# Patient Record
Sex: Female | Born: 2000
Health system: Southern US, Community
[De-identification: ages and names within clinical notes are randomized; demographics above are authoritative.]

## PROBLEM LIST (undated history)

## (undated) DIAGNOSIS — E039 Hypothyroidism, unspecified: Secondary | ICD-10-CM

## (undated) DIAGNOSIS — L309 Dermatitis, unspecified: Secondary | ICD-10-CM

## (undated) DIAGNOSIS — L709 Acne, unspecified: Secondary | ICD-10-CM

## (undated) DIAGNOSIS — E079 Disorder of thyroid, unspecified: Secondary | ICD-10-CM

## (undated) DIAGNOSIS — T7840XA Allergy, unspecified, initial encounter: Secondary | ICD-10-CM

## (undated) HISTORY — DX: Acne, unspecified: L70.9

## (undated) HISTORY — DX: Allergy, unspecified, initial encounter: T78.40XA

## (undated) HISTORY — DX: Dermatitis, unspecified: L30.9

## (undated) HISTORY — DX: Hypothyroidism, unspecified: E03.9

## (undated) HISTORY — DX: Disorder of thyroid, unspecified: E07.9

---
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
## 2019-01-06 ENCOUNTER — Other Ambulatory Visit (INDEPENDENT_AMBULATORY_CARE_PROVIDER_SITE_OTHER): Payer: Self-pay | Admitting: *Deleted

## 2019-01-06 DIAGNOSIS — E034 Atrophy of thyroid (acquired): Secondary | ICD-10-CM

## 2019-01-06 MED ORDER — LEVOTHYROXINE SODIUM 75 MCG PO TABS: ORAL_TABLET | ORAL | 1 refills | Status: DC

## 2019-01-09 ENCOUNTER — Encounter: Payer: Self-pay | Admitting: Allergy and Immunology

## 2019-01-09 ENCOUNTER — Ambulatory Visit (INDEPENDENT_AMBULATORY_CARE_PROVIDER_SITE_OTHER): Payer: BLUE CROSS/BLUE SHIELD | Admitting: Allergy and Immunology

## 2019-01-09 ENCOUNTER — Other Ambulatory Visit: Payer: Self-pay

## 2019-01-09 DIAGNOSIS — L2089 Other atopic dermatitis: Secondary | ICD-10-CM

## 2019-01-09 MED ORDER — PREDNISONE 10 MG PO TABS: ORAL_TABLET | ORAL | 0 refills | Status: DC

## 2019-01-09 MED ORDER — PIMECROLIMUS 1 % EX CREA: TOPICAL_CREAM | CUTANEOUS | 3 refills | Status: DC

## 2019-01-09 MED ORDER — MOMETASONE FUROATE 0.1 % EX OINT: TOPICAL_OINTMENT | CUTANEOUS | 3 refills | Status: AC

## 2019-01-09 NOTE — Patient Instructions (Addendum)
  1.  Shower followed by Elidel followed by mometasone 0.1% ointment 1 time per day to areas of eczema  2.  May apply Elidel followed by mometasone 0.1% ointment a second time a day if needed  3.  Prednisone 10 mg -1+1/2 tablet 1 time per day for 10 days, then 1 tablet 1 time per day for 10 days, then 1/2 tablet 1 time per day for 10 days  4.  Cetirizine 10 mg -1-2 tablets 1-2 times per day (MAX = 40 mg)  5.  Return to clinic in 30 days or earlier if problem

## 2019-01-09 NOTE — Progress Notes (Signed)
   [place] [facility] [place] [facility] [place] [facility] [place] - [place]   Follow-up Note  Referring Provider: Johny Blamer, MD Primary Provider: Johny Blamer, MD Date of Office Visit: 01/09/2019  Subjective:   Sherry Erickson (DOB: October 24, 2000) is a 18 y.o. female who [facility]2019 in re-evaluation of the following:  HPI: This is a E - Med visit requested by patient who is located at home.  Sherry Erickson was seen in this clinic for urticaria.  Her last visit to this clinic was 29 June 2015.  Bad Itchy rash for a year if not longer. Saw dermatologist and treated with topical agents. Minimal response. Apparently patch tested and allergic to gold.  Very active this spring. Given prednisone April with less rash but flare after finishing prednisone.  All her skin feels like sandpaper.   Allergies as of 01/09/2019      Reactions   Gold-containing Drug Products    Milk-related Compounds Rash      Medication List    diphenhydrAMINE 12.5 MG/5ML liquid Commonly known as:  BENADRYL Take by mouth 4 (four) times daily as needed.   hydrOXYzine 10 MG tablet Commonly known as:  ATARAX/VISTARIL USE AS DIRECTED AT BEDTIME 30   levothyroxine 75 MCG tablet Commonly known as:  SYNTHROID TAKE 1/2 TABLETS (37.5 MCG TOTAL) BY MOUTH DAILY.   loratadine 10 MG tablet Commonly known as:  CLARITIN Take 10 mg by mouth daily.       Past Medical History:  Diagnosis Date  . Acne   . Eczema   . Hypothyroidism     History reviewed. No pertinent surgical history.  Review of systems negative except as noted in HPI / PMHx or noted below:  Review of Systems  Constitutional: Negative.   HENT: Negative.   Eyes: Negative.   Respiratory: Negative.   Cardiovascular: Negative.   Gastrointestinal: Negative.   Genitourinary: Negative.   Musculoskeletal: Negative.   Skin: Negative.   Neurological: Negative.   Endo/Heme/Allergies: Negative.   Psychiatric/Behavioral:  Negative.      Objective:   There were no vitals filed for this visit.        Physical Exam  -via Web[employer] Koya shows me multiple patches of red excoriated plaques across arms and chest and abdomen.  Diagnostics: none  Assessment and Plan:   1. Other atopic dermatitis     1.  Shower followed by Elidel followed by mometasone 0.1% ointment 1 time per day to areas of eczema  2.  May apply Elidel followed by mometasone 0.1% ointment a second time a day if needed  3.  Prednisone 10 mg -1+1/2 tablet 1 time per day for 10 days, then 1 tablet 1 time per day for 10 days, then 1/2 tablet 1 time per day for 10 days  4.  Cetirizine 10 mg -1-2 tablets 1-2 times per day (MAX = 40 mg)  5.  Return to clinic in 30 days or earlier if problem  We will treat Sherry Erickson with a combination of therapy directed against inflammation of her skin and see her back in this clinic in 30 days to assess her response.  Should she not have optimal response or should she not be able to maintain optimal response after discontinuing prednisone she would be a candidate for dupilumab.  Total patient interaction time 20 minutes  Laurette Schimke, MD Allergy / Immunology [facility]

## 2019-01-13 ENCOUNTER — Encounter: Payer: Self-pay | Admitting: Allergy and Immunology

## 2019-01-23 NOTE — Progress Notes (Signed)
Patient is at home.  Provider is in the office.  Start time: 2:36 pm End time: 3:03 pm Verbal consent given to file insurance.

## 2019-02-10 ENCOUNTER — Ambulatory Visit: Payer: Self-pay | Admitting: Allergy and Immunology

## 2019-02-10 ENCOUNTER — Encounter: Payer: Self-pay | Admitting: Allergy and Immunology

## 2019-02-10 ENCOUNTER — Ambulatory Visit (INDEPENDENT_AMBULATORY_CARE_PROVIDER_SITE_OTHER): Payer: BC Managed Care – PPO | Admitting: Allergy and Immunology

## 2019-02-10 ENCOUNTER — Other Ambulatory Visit: Payer: Self-pay

## 2019-02-10 VITALS — BP 104/72 | HR 81 | Temp 98.7°F | Resp 16 | Ht <= 58 in | Wt 108.4 lb

## 2019-02-10 DIAGNOSIS — L2089 Other atopic dermatitis: Secondary | ICD-10-CM

## 2019-02-10 NOTE — Patient Instructions (Addendum)
  1.  Shower followed by Elidel followed by mometasone 0.1% ointment 1- 3 times per week to areas of eczema  2.  May apply Elidel followed by mometasone 0.1% ointment a second time a day if needed  3.  Cetirizine 10 mg -1-2 tablets 1-2 times per day (MAX = 40 mg)  4.  Dupilumab?  5.  Return to clinic in 8 weeks or earlier if problem

## 2019-02-10 NOTE — Progress Notes (Signed)
[facility]   Follow-up Note  Referring Provider: Shirline Frees, MD  Primary Provider: Shirline Frees, MD Date of Office Visit: 02/10/2019  Subjective:   Sherry Erickson (DOB: 04-27-2001) is a 18 y.o. female who returns to the [facility] on 02/10/2019 in re-evaluation of the following:  HPI: Sherry Erickson returns to this clinic and evaluation of atopic dermatitis.  We have made contact with each other on 09 Jan 2019 using an E-med visit at which point in time she had significant atopic dermatitis that was flared for which we started her on oral steroids.  Using a combination of topical agents and oral steroid she is much improved.  She is about 70% improved on this therapy.  She has 2 days of prednisone left.  Allergies as of 02/10/2019      Reactions   Gold-containing Drug Products    Milk-related Compounds Rash      Medication List    diphenhydrAMINE 12.5 MG/5ML liquid Commonly known as:  BENADRYL Take by mouth 4 (four) times daily as needed.   levothyroxine 75 MCG tablet Commonly known as:  SYNTHROID TAKE 1/2 TABLETS (37.5 MCG TOTAL) BY MOUTH DAILY.   loratadine 10 MG tablet Commonly known as:  CLARITIN Take 10 mg by mouth daily.   mometasone 0.1 % ointment Commonly known as:  ELOCON Apply to eczema once daily after bathing.  Can apply a second time of day if needed.   pimecrolimus 1 % cream Commonly known as:  ELIDEL Apply to eczema once daily after bathing.  Can apply a second time a day if needed.   predniSONE 10 MG tablet Commonly known as:  DELTASONE Take one and one-half tablet daily for ten days; then take one tablet daily for ten days; then take one-half tablet daily for ten days.       Past Medical History:  Diagnosis Date  . Acne   . Eczema   . Hypothyroidism     History reviewed. No pertinent surgical history.  Review of systems negative except as noted in HPI / PMHx or noted below:   Review of Systems  Constitutional: Negative.   HENT: Negative.   Eyes: Negative.   Respiratory: Negative.   Cardiovascular: Negative.   Gastrointestinal: Negative.   Genitourinary: Negative.   Musculoskeletal: Negative.   Skin: Negative.   Neurological: Negative.   Endo/Heme/Allergies: Negative.   Psychiatric/Behavioral: Negative.      Objective:   Vitals:   02/10/19 0837  BP: 104/72  Pulse: 81  Resp: 16  Temp: 98.7 F (37.1 C)  SpO2: 99%   Height: 4\' 10"  (147.3 cm)  Weight: 108 lb 6.4 oz (49.2 kg)   Physical Exam Constitutional:      Appearance: She is not diaphoretic.  HENT:     Head: Normocephalic.     Right Ear: Tympanic membrane, ear canal and external ear normal.     Left Ear: Tympanic membrane, ear canal and external ear normal.     Nose: Nose normal. No mucosal edema or rhinorrhea.     Mouth/Throat:     Pharynx: Uvula midline. No oropharyngeal exudate.  Eyes:     Conjunctiva/sclera: Conjunctivae normal.  Neck:     Thyroid: No thyromegaly.     Trachea: Trachea normal. No tracheal tenderness or tracheal deviation.  Cardiovascular:     Rate and Rhythm: Normal rate and regular rhythm.     Heart sounds: Normal heart sounds, S1 normal and  S2 normal. No murmur.  Pulmonary:     Effort: No respiratory distress.     Breath sounds: Normal breath sounds. No stridor. No wheezing or rales.  Lymphadenopathy:     Head:     Right side of head: No tonsillar adenopathy.     Left side of head: No tonsillar adenopathy.     Cervical: No cervical adenopathy.  Skin:    Findings: Rash (Lichenified hyperpigmented slightly erythematous antecubital fossa, lateral neck, abdomen, lower back.) present. No erythema.     Nails: There is no clubbing.   Neurological:     Mental Status: She is alert.     Diagnostics: none  Assessment and Plan:   1. Other atopic dermatitis       1.  Shower followed by Elidel followed by mometasone 0.1% ointment 1- 3 times per week to areas  of eczema  2.  May apply Elidel followed by mometasone 0.1% ointment a second time a day if needed  3.  Cetirizine 10 mg -1-2 tablets 1-2 times per day (MAX = 40 mg)  4.  Dupilumab?  5.  Return to clinic in 8 weeks or earlier if problem  Sherry Erickson is better.  Her improvement comes in the context of using a systemic steroid along with topical agents.  If she flares once again as she completes her prednisone then she needs to consider starting dupilumab.  I have given her literature on this form of treatment during today's visit.  We will see what happens over the course of the next 8 weeks.  Laurette SchimkeEric Kozlow, MD Allergy / Immunology [facility]

## 2019-02-11 ENCOUNTER — Encounter: Payer: Self-pay | Admitting: Allergy and Immunology

## 2019-02-11 ENCOUNTER — Ambulatory Visit: Payer: Self-pay | Admitting: Allergy and Immunology

## 2019-02-13 ENCOUNTER — Other Ambulatory Visit: Payer: Self-pay | Admitting: *Deleted

## 2019-02-13 ENCOUNTER — Telehealth: Payer: Self-pay | Admitting: *Deleted

## 2019-02-13 NOTE — Telephone Encounter (Signed)
Patient's mom states that she feels like the patient breaks out to both Hydroxyzine and Cetirizine makes the rash worse. Mom states the Prednisone was helping but once she stated taking the Cetirizine a new rash to come up. Mom also states they feel like she may have a coconut allergy and would like to do some more testing in the future.

## 2019-02-17 NOTE — Telephone Encounter (Signed)
Please have Nanci start dupilumab.

## 2019-02-17 NOTE — Telephone Encounter (Signed)
L/M for patient to contact me to discuss.  Will need to get her Rx card.

## 2019-02-18 NOTE — Telephone Encounter (Signed)
Spoke to patient and mother regarding Dupixent.  Patient advised she wants think about it and she will let us know if she wants to proceed with therapy.

## 2019-03-10 ENCOUNTER — Other Ambulatory Visit (INDEPENDENT_AMBULATORY_CARE_PROVIDER_SITE_OTHER): Payer: Self-pay | Admitting: *Deleted

## 2019-03-10 DIAGNOSIS — E039 Hypothyroidism, unspecified: Secondary | ICD-10-CM

## 2019-03-11 LAB — T4, FREE: Free T4: 1.2 ng/dL (ref 0.8–1.4)

## 2019-03-11 LAB — TSH: TSH: 3.51 mIU/L

## 2019-03-11 LAB — T4: T4, Total: 9.4 ug/dL (ref 5.3–11.7)

## 2019-03-20 ENCOUNTER — Ambulatory Visit (INDEPENDENT_AMBULATORY_CARE_PROVIDER_SITE_OTHER): Payer: BLUE CROSS/BLUE SHIELD | Admitting: Family

## 2019-03-21 ENCOUNTER — Encounter (INDEPENDENT_AMBULATORY_CARE_PROVIDER_SITE_OTHER): Payer: Self-pay | Admitting: Family

## 2019-03-21 ENCOUNTER — Other Ambulatory Visit: Payer: Self-pay

## 2019-03-21 ENCOUNTER — Ambulatory Visit (INDEPENDENT_AMBULATORY_CARE_PROVIDER_SITE_OTHER): Payer: BC Managed Care – PPO | Admitting: Family

## 2019-03-21 VITALS — BP 96/72 | HR 72 | Wt 107.2 lb

## 2019-03-21 DIAGNOSIS — R6252 Short stature (child): Secondary | ICD-10-CM

## 2019-03-21 DIAGNOSIS — E039 Hypothyroidism, unspecified: Secondary | ICD-10-CM

## 2019-03-21 NOTE — Patient Instructions (Signed)
37.5 mcg of levothyroxine  Add fiber supplement daily to help with occasional constipation   6 month follow up

## 2019-03-21 NOTE — Progress Notes (Signed)
This is a Pediatric Specialist E-Visit follow up consult provided via [place] and their parent/guardian [name] consented to an E-Visit consult today.  Location of patient: [name] is at home  Location of provider: Hermenia Bers FNP is at home office  Patient was referred by Shirline Frees, MD   The following participants were involved in this E-Visit: Bethann Goo RMA, [place], Hermenia Bers FNP Sherry Erickson patient Sherry Erickson   Chief Complain/ Reason for E-Visit today: Hypothyroid follow up  Total time on call: This visit lasted >15 minutes. More then 50% of the visit was devoted to counseling.  Follow up: 6 months.      Subjective:  Subjective  Patient Name: Sherry Erickson Date of Birth: May 22, 2001  MRN: [id number]  Sherry Erickson  presents to the office today for follow-up evaluation and management of her hypothyroidism, unknown age, delayed bone age for suspected age.    HISTORY OF PRESENT ILLNESS:   Sherry Erickson is a 18 y.o. [name]   Sherry Erickson was accompanied by her mother.   1. Sherry Erickson was adopted from [place] in December 2012. She was believed to be about 18 years old at the time with an assigned birth date of September 30, 2000.She was reportedly found by the orphanage several days after birth.  She was adopted from an orphanage in a poor area with limited access to nutrition or medical care. On arrival in the [place] she had a battery of labs which included thyroid function studies and a bone age. Her bone age was read by radiology as consistent with 10 years. We read her film together in clinic today and felt that it was not as advanced as 10 although it is more advanced than 8 years 10 months (the prior plate). This would put her bone age somewhere around 9 years 3-6 months.   Her TSH on her initial labs was 6.32 with a free T4 of 0.87 ng/mL and a total T3 of 154 ng/dL.      2. The patient's last [facility] visit was on 12/2018. In the interim, she has been generally  healthy.    She reports that she is doing well. She is taking Levothyroxine every morning on empty stomach. No missed doses. Denies fatigue, constipation and cold intolerance. She has been working on improving her diet because her Erickson feels like it is not very health. She loves sweets. 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   3. Pertinent Review of Systems:  Review of Systems  Constitutional: Negative for malaise/fatigue and weight loss.  Eyes: Negative for blurred vision and photophobia.  Respiratory: Negative for cough and wheezing.   Cardiovascular: Negative for chest pain and palpitations.  Gastrointestinal: Negative for abdominal pain, constipation, diarrhea, nausea and vomiting.  Genitourinary: Negative for frequency and urgency.  Musculoskeletal: Negative for neck pain.  Neurological: Negative for tremors, sensory change, weakness and headaches.  Endo/Heme/Allergies: Negative for polydipsia.  Psychiatric/Behavioral: Negative for depression. The patient is not nervous/anxious.    Marland Kitchen   PAST MEDICAL, FAMILY, AND SOCIAL HISTORY  Past Medical History:  Diagnosis Date  . Acne   . Eczema   . Hypothyroidism     Family History  Adopted: Yes     Current Outpatient Medications:  .  diphenhydrAMINE (BENADRYL) 12.5 MG/5ML liquid, Take by mouth 4 (four) times daily as needed., Disp: , Rfl:  .  levothyroxine (SYNTHROID) 75 MCG tablet, TAKE 1/2 TABLETS (37.5 MCG  TOTAL) BY MOUTH DAILY., Disp: 45 tablet, Rfl: 1 .  loratadine (CLARITIN) 10 MG tablet, Take 10 mg by mouth daily., Disp: , Rfl:  .  mometasone (ELOCON) 0.1 % ointment, Apply to eczema once daily after bathing.  Can apply a second time of day if needed., Disp: 90 g, Rfl: 3 .  pimecrolimus (ELIDEL) 1 % cream, Apply to eczema once daily after bathing.  Can apply a second time a day if needed., Disp: 100 g, Rfl: 3 .  predniSONE (DELTASONE) 10 MG tablet, Take one and one-half tablet  daily for ten days; then take one tablet daily for ten days; then take one-half tablet daily for ten days., Disp: 30 tablet, Rfl: 0  Allergies as of 03/21/2019 - Review Complete 02/11/2019  Allergen Reaction Noted  . Gold-containing drug products  06/18/2018  . Milk-related compounds Rash 05/24/2015     reports that she has never smoked. She has never used smokeless tobacco. She reports that she does not drink alcohol or use drugs. Pediatric History  Patient Parents  . Sherry Erickson (Father)  . Sherry Erickson (Mother)   Other Topics Concern  . 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 Lives with adopted parents and 5 siblings. Came to live with family in December 2012 from [place]. Adopted another child in March 2014 (was a friend from orphanage).    Home school- Middle school level.   Primary Care Provider: Johny BlamerHarris, William, MD  ROS: There are no other significant problems involving [name] other body systems.    Objective:  Objective  Vital Signs:  There were no vitals taken for this visit.  Blood pressure percentiles are not available for patients who are 18 years or older.  Ht Readings from Last 3 Encounters:  02/10/19 4\' 10"  (1.473 m) (<1 %, Z= -2.45)*  12/18/18 4' 8.75" (1.441 m) (<1 %, Z= -2.94)*  06/18/18 4' 8.81" (1.443 m) (<1 %, Z= -2.91)*   * Growth percentiles are based on CDC (Girls, 2-20 Years) data.   Wt Readings from Last 3 Encounters:  02/10/19 108 lb 6.4 oz (49.2 kg) (14 %, Z= -1.10)*  12/18/18 106 lb (48.1 kg) (10 %, Z= -1.27)*  06/18/18 104 lb (47.2 kg) (9 %, Z= -1.37)*   * Growth percentiles are based on CDC (Girls, 2-20 Years) data.   HC Readings from Last 3 Encounters:  No data found for [facility]   There is no height or weight on file to calculate BSA. No height on file for this encounter. No weight on file for this encounter.    PHYSICAL EXAM: General: Well developed, well nourished female in no acute distress.  Alert  and oriented.  Head: Normocephalic, atraumatic.   Eyes:  Pupils equal and round. EOMI.   Sclera white.  No eye drainage.   Ears/Nose/Mouth/Throat: Nares patent, no nasal drainage.  Normal dentition, mucous membranes moist.   Neck: supple no thyromegaly Cardiovascular: NO cyanosis.  Respiratory: No increased work of breathing.  Skin: warm, dry.  No rash or lesions. Neurologic: alert and oriented, normal speech, no tremor  LAB DATA:   Results for orders placed or performed in visit on 03/10/19 (from the past 672 hour(s))  T4   Collection Time: 03/10/19  8:11 AM  Result Value Ref Range   T4, Total 9.4 5.3 - 11.7 mcg/dL  T4, free   Collection Time: 03/10/19  8:11 AM  Result Value Ref Range   Free T4 1.2 0.8 - 1.4  ng/dL  TSH   Collection Time: 03/10/19  8:11 AM  Result Value Ref Range   TSH 3.51 mIU/L      Assessment and Plan:  Assessment  ASSESSMENT:  1. Hypothyroidism- Clinically and biochemically euthyroid on 37.5 mcg of levothyroxine.  2. Short stature- Her height is in the <1% for her age. There is likely a combination of genetic, environmental and endocrinology factors. She has completed her linear growth at this point.  3. Eczema:Improved.       PLAN:  1. Diagnostic:TFTs as above. Repeat prior to next visit.  2. Therapeutic: Take 37.5 mcg of levothyroxine per day  3. Patient education: REviewed signs and symptoms of hypothyroidism. Discussed importance of taking medication in the morning on empty stomach. Reviewed growth chart. Answered questions.  4. Follow-up: 6 months.       Gretchen ShortSpenser Myah Guynes,  FNP-C  Pediatric Specialist  [address]  [place] [id number]  Tele: [phone]

## 2019-04-07 ENCOUNTER — Ambulatory Visit (INDEPENDENT_AMBULATORY_CARE_PROVIDER_SITE_OTHER): Payer: BC Managed Care – PPO | Admitting: Allergy and Immunology

## 2019-04-07 ENCOUNTER — Encounter: Payer: Self-pay | Admitting: Allergy and Immunology

## 2019-04-07 ENCOUNTER — Other Ambulatory Visit: Payer: Self-pay

## 2019-04-07 VITALS — BP 99/64 | HR 77 | Wt 104.5 lb

## 2019-04-07 DIAGNOSIS — L2089 Other atopic dermatitis: Secondary | ICD-10-CM

## 2019-04-07 NOTE — Progress Notes (Signed)
[name]   Follow-up Note  Referring Provider: Shirline Frees, MD Primary Provider: Shirline Frees, MD Date of Office Visit: 04/07/2019  Subjective:   Sherry Erickson (DOB: 09-May-2001) is a 18 y.o. female who returns to the [facility] [place] on 04/07/2019 in re-evaluation of the following:  HPI: This is a E-med visit requested by patient who is located at home.  Sherry Erickson is followed in this clinic for atopic dermatitis.  I last saw her in this clinic on 10 February 2019 at which point in time she was finishing up a systemic steroid taper in the treatment of her atopic dermatitis.  Skin Ok without itch or redness or scale while using mometasone daily applied to arms, legs, neck. Minimal Elidil use at this point. Not using cetirizine as this made skin worse. Benadryl 25 mg at bedtime. May have vaginal yeast infection and scheduled to take a pill for that issue from her primary doctor.    Allergies as of 04/07/2019      Reactions   Gold-containing Drug Products    Coconut Oil Rash   Milk-related Compounds Rash      Medication List        diphenhydrAMINE 12.5 MG/5ML liquid Commonly known as: BENADRYL Take by mouth 4 (four) times daily as needed.   fluconazole 150 MG tablet Commonly known as: DIFLUCAN   levothyroxine 75 MCG tablet Commonly known as: SYNTHROID TAKE 1/2 TABLETS (37.5 MCG TOTAL) BY MOUTH DAILY.   loratadine 10 MG tablet Commonly known as: CLARITIN Take 10 mg by mouth daily.   mometasone 0.1 % ointment Commonly known as: ELOCON Apply to eczema once daily after bathing.  Can apply a second time of day if needed.       Past Medical History:  Diagnosis Date  . Acne   . Eczema   . Hypothyroidism     History reviewed. No pertinent surgical history.  Review of systems negative except as noted in HPI / PMHx or noted below:  Review of Systems  Constitutional: Negative.   HENT: Negative.   Eyes: Negative.    Respiratory: Negative.   Cardiovascular: Negative.   Gastrointestinal: Negative.   Genitourinary: Negative.   Musculoskeletal: Negative.   Skin: Negative.   Neurological: Negative.   Endo/Heme/Allergies: Negative.   Psychiatric/Behavioral: Negative.      Objective:   Vitals:   04/07/19 0910  BP: 99/64  Pulse: 77      Weight: 104 lb 8 oz (47.4 kg)(Patient reported)   Physical Exam-deferred  Diagnostics: none  Assessment and Plan:   1. Other atopic dermatitis       1.  Shower followed by Elidel followed by mometasone 0.1% ointment 1- 3 times per week to areas of eczema  2.  May apply Elidel followed by mometasone 0.1% ointment a second time a day if needed  3.  Obtain fall flu vaccine (and COVID vaccine)  4.  Return to clinic in 6 months or earlier if problem  Sherry Erickson appears to be doing OK on her current plan which basically includes spot treatment with topical mometasone without Elidil at this point. Assuming she continue to do well on this plan she will continue to use the least amount of topical steroid required to maintain good control of her skin inflammation.  I will see her back in this clinic in 6 months or earlier if there is a problem.  Sherry Katz, MD Allergy / Immunology [facility]

## 2019-04-07 NOTE — Patient Instructions (Addendum)
  1.  Shower followed by Elidel followed by mometasone 0.1% ointment 1- 3 times per week to areas of eczema  2.  May apply Elidel followed by mometasone 0.1% ointment a second time a day if needed  3.  Obtain fall flu vaccine (and COVID vaccine)  4.  Return to clinic in 6 months or earlier if problem

## 2019-04-08 ENCOUNTER — Encounter: Payer: Self-pay | Admitting: Allergy and Immunology

## 2019-05-02 NOTE — Progress Notes (Signed)
Telephone Visit  Patient is at home.  Provider is in the office. Start time: 9:06 pm End time: 9:26 pm Verbal consent was given to file insurance.

## 2019-05-29 ENCOUNTER — Other Ambulatory Visit (INDEPENDENT_AMBULATORY_CARE_PROVIDER_SITE_OTHER): Payer: Self-pay | Admitting: Family

## 2019-05-29 DIAGNOSIS — E034 Atrophy of thyroid (acquired): Secondary | ICD-10-CM

## 2019-07-24 ENCOUNTER — Other Ambulatory Visit (INDEPENDENT_AMBULATORY_CARE_PROVIDER_SITE_OTHER): Payer: Self-pay | Admitting: Family

## 2019-07-24 DIAGNOSIS — E034 Atrophy of thyroid (acquired): Secondary | ICD-10-CM

## 2019-08-25 ENCOUNTER — Encounter (INDEPENDENT_AMBULATORY_CARE_PROVIDER_SITE_OTHER): Payer: Self-pay | Admitting: Family

## 2019-09-22 ENCOUNTER — Ambulatory Visit (INDEPENDENT_AMBULATORY_CARE_PROVIDER_SITE_OTHER): Payer: BC Managed Care – PPO | Admitting: Family

## 2019-10-30 ENCOUNTER — Telehealth (INDEPENDENT_AMBULATORY_CARE_PROVIDER_SITE_OTHER): Payer: Commercial Managed Care - PPO | Admitting: Family

## 2019-10-30 ENCOUNTER — Other Ambulatory Visit: Payer: Self-pay

## 2019-10-30 ENCOUNTER — Encounter (INDEPENDENT_AMBULATORY_CARE_PROVIDER_SITE_OTHER): Payer: Self-pay | Admitting: Family

## 2019-10-30 VITALS — Wt 105.0 lb

## 2019-10-30 DIAGNOSIS — R6252 Short stature (child): Secondary | ICD-10-CM | POA: Diagnosis not present

## 2019-10-30 DIAGNOSIS — E039 Hypothyroidism, unspecified: Secondary | ICD-10-CM | POA: Diagnosis not present

## 2019-10-30 NOTE — Progress Notes (Signed)
This is a Pediatric Specialist E-Visit follow up consult provided via facetime Sherry Erickson consented to an E-Visit consult today.  Location of patient: Everlynn is at home Location of provider: Melissa Noon is at [place] office (location) Patient was referred by Shirline Frees, MD   The following participants were involved in this E-Visit: Sherry Erickson, Mother and Sherry Hibbitts FNP   Chief Complain/ Reason for E-Visit today: Hypothyroidism FU Total time on call: >20 spent today reviewing the medical chart, counseling the patient/family, and documenting today's visit.   Follow up: 6 months.     Subjective:  Subjective  Patient Name: Sherry Erickson Date of Birth: 29-Apr-2001  MRN: [id number]  [name]  presents to the office today for follow-up evaluation and management of her hypothyroidism, unknown age, delayed bone age for suspected age.    HISTORY OF PRESENT ILLNESS:   [name] is a 19 y.o. [name]   Emie was accompanied by her mother.   1. Cimberly was adopted from [place] in December 2012. She was believed to be about 19 years old at the time with an assigned birth date of 2001-01-09.She was reportedly found by the orphanage several days after birth.  She was adopted from an orphanage in a poor area with limited access to nutrition or medical care. On arrival in the [place] she had a battery of labs which included thyroid function studies and a bone age. Her bone age was read by radiology as consistent with 10 years. We read her film together in clinic today and felt that it was not as advanced as 10 although it is more advanced than 8 years 10 months (the prior plate). This would put her bone age somewhere around 9 years 3-6 months.   Her TSH on her initial labs was 6.32 with a free T4 of 0.87 ng/mL and a total T3 of 154 ng/dL.      2. The patient's last PSSG visit was on 05/2019. In the interim, she has been generally healthy.    She has mainly been staying inside at home. Her mother is high risk  for COVID 19 so the family has been isolating. She reports taking 37.5 mcg of levothyroxine per day. Denies fatigue, cold intolerance.   3. Pertinent Review of Systems:  Review of Systems  Constitutional: Negative for malaise/fatigue and weight loss.  Eyes: Negative for blurred vision and photophobia.  Respiratory: Negative for cough and wheezing.   Cardiovascular: Negative for chest pain and palpitations.  Gastrointestinal: Negative for abdominal pain, constipation, diarrhea, nausea and vomiting.  Genitourinary: Negative for frequency and urgency.  Musculoskeletal: Negative for neck pain.  Neurological: Negative for tremors, sensory change, weakness and headaches.  Endo/Heme/Allergies: Negative for polydipsia.  Psychiatric/Behavioral: Negative for depression. The patient is not nervous/anxious.    Marland Kitchen   PAST MEDICAL, FAMILY, AND SOCIAL HISTORY  Past Medical History:  Diagnosis Date  . Acne   . Eczema   . Hypothyroidism     Family History  Adopted: Yes     Current Outpatient Medications:  .  diphenhydrAMINE (BENADRYL) 12.5 MG/5ML liquid, Take by mouth 4 (four) times daily as needed., Disp: , Rfl:  .  fluconazole (DIFLUCAN) 150 MG tablet, , Disp: , Rfl:  .  levothyroxine (SYNTHROID) 75 MCG tablet, TAKE 1/2 TABLETS BY MOUTH  DAILY., Disp: 45 tablet, Rfl: 3 .  loratadine (CLARITIN) 10 MG tablet, Take 10 mg by mouth daily., Disp: , Rfl:  .  mometasone (ELOCON) 0.1 % ointment, Apply to eczema once daily  after bathing.  Can apply a second time of day if needed., Disp: 90 g, Rfl: 3  Allergies as of 10/30/2019 - Review Complete 10/30/2019  Allergen Reaction Noted  . Gold-containing drug products  06/18/2018  . Coconut oil Rash 03/21/2019  . Milk-related compounds Rash 05/24/2015     reports that she has never smoked. She has never used smokeless tobacco. She reports that she does not drink alcohol or use drugs. Pediatric History  Patient Parents  . Picker,Robert (Father)  .  Weisner,Regina (Mother)   Other Topics Concern  . 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 Lives with adopted parents and 5 siblings. Came to live with family in December 2012 from [place]. Adopted another child in March 2014 (was a friend from orphanage).    Home school- Middle school level.   Primary Care Provider: Johny Blamer, MD  ROS: There are no other significant problems involving [name]'s other body systems.    Objective:  Objective  Vital Signs:  Wt 105 lb (47.6 kg)   BMI 21.95 kg/m   Blood pressure percentiles are not available for patients who are 18 years or older.  Ht Readings from Last 3 Encounters:  02/10/19 4\' 10"  (1.473 m) (<1 %, Z= -2.45)*  12/18/18 4' 8.75" (1.441 m) (<1 %, Z= -2.94)*  06/18/18 4' 8.81" (1.443 m) (<1 %, Z= -2.91)*   * Growth percentiles are based on CDC (Girls, 2-20 Years) data.   Wt Readings from Last 3 Encounters:  10/30/19 105 lb (47.6 kg) (8 %, Z= -1.41)*  04/07/19 104 lb 8 oz (47.4 kg) (8 %, Z= -1.41)*  03/21/19 107 lb 3.2 oz (48.6 kg) (11 %, Z= -1.20)*   * Growth percentiles are based on CDC (Girls, 2-20 Years) data.   HC Readings from Last 3 Encounters:  No data found for [facility]   Body surface area is 1.4 meters squared. No height on file for this encounter. 8 %ile (Z= -1.41) based on CDC (Girls, 2-20 Years) weight-for-age data using vitals from 10/30/2019.    PHYSICAL EXAM: General: Well developed, well nourished female in no acute distress.  Alert and oriented.  Head: Normocephalic, atraumatic.   Eyes:  Pupils equal and round. EOMI.   Sclera white.  No eye drainage.   Ears/Nose/Mouth/Throat: Nares patent, no nasal drainage.  Normal dentition, mucous membranes moist.   Neck: supple, Cardiovascular: No cyanosis.  Respiratory: No increased work of breathing.  Skin: warm, dry.  No rash or lesions. Neurologic: alert and oriented, normal speech, no tremor  LAB DATA:   No results found for this or  any previous visit (from the past 672 hour(s)).    Assessment and Plan:  Assessment  ASSESSMENT:  1. Hypothyroidism- She is clinically euthyroid on 37.5 mcg of levothyroxine per day  2. Short stature- Her height is in the <1% for her age. There is likely a combination of genetic, environmental and endocrinology factors. Linear growth is complete.  3. Eczema:Improved.       PLAN:  1. Diagnostic:TSH, FT4 and T4 ordered  2. Therapeutic: Take 37.5 mcg of levothyroxine per day  3. Patient education: Reviewed signs and symptoms of hypothyroidism. Advised to take medication every morning as prescribed. Answered questions.  4. Follow-up: 6 months.       11/01/2019,  FNP-C  Pediatric Specialist  [address]  [place], [place]  Tele: [phone]

## 2019-10-30 NOTE — Patient Instructions (Signed)
37.5 mcg of levothyroxine per day  Lab orders placed

## 2019-12-19 ENCOUNTER — Ambulatory Visit: Payer: Self-pay | Attending: Internal Medicine

## 2019-12-19 DIAGNOSIS — Z23 Encounter for immunization: Secondary | ICD-10-CM

## 2019-12-19 NOTE — Progress Notes (Signed)
   Covid-19 Vaccination Clinic  Name:  Sherry Erickson    MRN: [id number] DOB: August 11, 2001  12/19/2019  [name] was observed post Covid-19 immunization for 15 minutes without incident. She was provided with Vaccine Information Sheet and instruction to access the V-Safe system.   [name] was instructed to call 911 with any severe reactions post vaccine: Marland Kitchen Difficulty breathing  . Swelling of face and throat  . A fast heartbeat  . A bad rash all over body  . Dizziness and weakness   Immunizations Administered    Name Date Dose VIS Date Route   Pfizer COVID-19 Vaccine 12/19/2019  8:13 AM 0.3 mL 08/15/2019 Intramuscular   Manufacturer: [employer], [employer]   Lot: [id number]   NDC: [address]-9

## 2020-01-12 ENCOUNTER — Ambulatory Visit: Payer: Self-pay | Attending: Internal Medicine

## 2020-01-12 DIAGNOSIS — Z23 Encounter for immunization: Secondary | ICD-10-CM

## 2020-01-12 NOTE — Progress Notes (Signed)
   Covid-19 Vaccination Clinic  Name:  Sherry Erickson    MRN: [id number] DOB: May 18, 2001  01/12/2020  Ms. Sherry Erickson was observed post Covid-19 immunization for 15 minutes without incident. She was provided with Vaccine Information Sheet and instruction to access the V-Safe system.   Ms. Sherry Erickson was instructed to call 911 with any severe reactions post vaccine: Marland Kitchen Difficulty breathing  . Swelling of face and throat  . A fast heartbeat  . A bad rash all over body  . Dizziness and weakness   Immunizations Administered    Name Date Dose VIS Date Route   Pfizer COVID-19 Vaccine 01/12/2020  8:51 AM 0.3 mL 10/29/2018 Intramuscular   Manufacturer: [employer], [employer]   Lot: [id number]   NDC: [address]-1

## 2020-04-23 ENCOUNTER — Telehealth (INDEPENDENT_AMBULATORY_CARE_PROVIDER_SITE_OTHER): Payer: Self-pay | Admitting: Family

## 2020-04-23 NOTE — Telephone Encounter (Signed)
Please make sure current lab orders are not expired, Colleen will have these done Monday at the [facility]. Location.

## 2020-04-26 NOTE — Telephone Encounter (Signed)
Verified labs have not end date in requisition.

## 2020-04-28 ENCOUNTER — Telehealth (INDEPENDENT_AMBULATORY_CARE_PROVIDER_SITE_OTHER): Payer: Commercial Managed Care - PPO | Admitting: Family

## 2020-04-28 ENCOUNTER — Other Ambulatory Visit: Payer: Self-pay

## 2020-04-28 DIAGNOSIS — R6252 Short stature (child): Secondary | ICD-10-CM | POA: Diagnosis not present

## 2020-04-28 DIAGNOSIS — E039 Hypothyroidism, unspecified: Secondary | ICD-10-CM | POA: Diagnosis not present

## 2020-04-28 LAB — T4, FREE: Free T4: 1.2 ng/dL (ref 0.8–1.4)

## 2020-04-28 LAB — T4: T4, Total: 8 ug/dL (ref 5.3–11.7)

## 2020-04-28 LAB — TSH: TSH: 2.03 mIU/L

## 2020-04-28 NOTE — Patient Instructions (Signed)
-  Signs of hypothyroidism (underactive thyroid) include increased sleep, sluggishness, weight gain, and constipation. -Signs of hyperthyroidism (overactive thyroid) include difficulty sleeping, diarrhea, heart racing, weight loss, or irritability  Please let me know if you develop any of these symptoms so we can repeat your thyroid tests.

## 2020-04-28 NOTE — Progress Notes (Signed)
This is a Pediatric Specialist E-Visit follow up consult provided via telephone Sherry Erickson consented to an E-Visit consult today.  Location of patient: Sherry Erickson is at home  Location of provider: Sherene Sires is at office.  Patient was referred by Johny Blamer, MD   The following participants were involved in this E-Visit:   Chief Complain/ Reason for E-Visit today: Hypothyroid FU   Total time on call: > 15 spent today reviewing the medical chart, counseling the patient/family, and documenting today's visit.   Follow up: 6 months.      Subjective:  Subjective  Patient Name: Sherry Erickson Date of Birth: Jan 07, 2001  MRN: [id number]  Sherry Erickson  presents to the office today for follow-up evaluation and management of her hypothyroidism, unknown age, delayed bone age for suspected age.    HISTORY OF PRESENT ILLNESS:   Sherry Erickson is a 19 y.o. [name]   Sherry Erickson was accompanied by her mother.   1. Sherry Erickson was adopted from [place] in December 2012. She was believed to be about 19 years old at the time with an assigned birth date of 30-May-2001.She was reportedly found by the orphanage several days after birth.  She was adopted from an orphanage in a poor area with limited access to nutrition or medical care. On arrival in the [place] she had a battery of labs which included thyroid function studies and a bone age. Her bone age was read by radiology as consistent with 10 years. We read her film together in clinic today and felt that it was not as advanced as 10 although it is more advanced than 8 years 10 months (the prior plate). This would put her bone age somewhere around 9 years 3-6 months.   Her TSH on her initial labs was 6.32 with a free T4 of 0.87 ng/mL and a total T3 of 154 ng/dL.      2. The patient's last PSSG visit was on 10/2019. In the interim, she has been generally healthy.    She is doing well, mainly staying at home and helping with family after father recently had MI. She has  started exercising daily on treadmill and is eating healthy. Taking 37.5 mcg of levothyroxine per day, rarely misses any doses. Denies fatigue, cold intolerance and constipation    3. Pertinent Review of Systems:  Review of Systems  Constitutional: Negative for malaise/fatigue and weight loss.  Eyes: Negative for blurred vision and photophobia.  Respiratory: Negative for cough and wheezing.   Cardiovascular: Negative for chest pain and palpitations.  Gastrointestinal: Negative for abdominal pain, constipation, diarrhea, nausea and vomiting.  Genitourinary: Negative for frequency and urgency.  Musculoskeletal: Negative for neck pain.  Neurological: Negative for tremors, sensory change, weakness and headaches.  Endo/Heme/Allergies: Negative for polydipsia.  Psychiatric/Behavioral: Negative for depression. The patient is not nervous/anxious.    Marland Kitchen   PAST MEDICAL, FAMILY, AND SOCIAL HISTORY  Past Medical History:  Diagnosis Date  . Acne   . Allergy    Phreesia 04/28/2020  . Eczema   . Hypothyroidism   . Thyroid disease    Phreesia 04/28/2020    Family History  Adopted: Yes     Current Outpatient Medications:  .  diphenhydrAMINE (BENADRYL) 12.5 MG/5ML liquid, Take by mouth 4 (four) times daily as needed., Disp: , Rfl:  .  fluconazole (DIFLUCAN) 150 MG tablet, , Disp: , Rfl:  .  levothyroxine (SYNTHROID) 75 MCG tablet, TAKE 1/2 TABLETS BY MOUTH  DAILY., Disp: 45 tablet, Rfl: 3 .  loratadine (CLARITIN) 10 MG tablet, Take 10 mg by mouth daily., Disp: , Rfl:  .  mometasone (ELOCON) 0.1 % ointment, Apply to eczema once daily after bathing.  Can apply a second time of day if needed., Disp: 90 g, Rfl: 3  Allergies as of 04/28/2020 - Review Complete 04/28/2020  Allergen Reaction Noted  . Amoxicillin Hives, Itching, and Rash 04/28/2020  . Other Itching and Rash 04/28/2020  . Gold-containing drug products  06/18/2018  . Coconut oil Rash 03/21/2019  . Milk-related compounds Rash  05/24/2015     reports that she has never smoked. She has never used smokeless tobacco. She reports that she does not drink alcohol and does not use drugs. Pediatric History  Patient Parents  . Schoenfeldt,Robert (Father)  . Bueno,Regina (Mother)   Other Topics Concern  . 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 Lives with adopted parents and 5 siblings. Came to live with family in December 2012 from [place]. Adopted another child in March 2014 (was a friend from orphanage).    Home school- Middle school level.   Primary Care Provider: Johny Blamer, MD  ROS: There are no other significant problems involving Sherry Erickson's other body systems.    Objective:  Objective  Vital Signs:  There were no vitals taken for this visit.  Growth percentile SmartLinks can only be used for patients less than 30 years old.  Ht Readings from Last 3 Encounters:  02/10/19 4\' 10"  (1.473 m) (<1 %, Z= -2.45)*  12/18/18 4' 8.75" (1.441 m) (<1 %, Z= -2.94)*  06/18/18 4' 8.81" (1.443 m) (<1 %, Z= -2.91)*   * Growth percentiles are based on CDC (Girls, 2-20 Years) data.   Wt Readings from Last 3 Encounters:  10/30/19 105 lb (47.6 kg) (8 %, Z= -1.41)*  04/07/19 104 lb 8 oz (47.4 kg) (8 %, Z= -1.41)*  03/21/19 107 lb 3.2 oz (48.6 kg) (11 %, Z= -1.20)*   * Growth percentiles are based on CDC (Girls, 2-20 Years) data.   HC Readings from Last 3 Encounters:  No data found for [facility]   There is no height or weight on file to calculate BSA. Facility age limit for growth percentiles is 20 years. Facility age limit for growth percentiles is 20 years.    PHYSICAL EXAM: telehealth visit. Unable to examine.    LAB DATA:   Results for orders placed or performed in visit on 10/30/19 (from the past 672 hour(s))  T4   Collection Time: 04/27/20 10:22 AM  Result Value Ref Range   T4, Total 8.0 5.3 - 11.7 mcg/dL  T4, free   Collection Time: 04/27/20 10:22 AM  Result Value Ref Range    Free T4 1.2 0.8 - 1.4 ng/dL  TSH   Collection Time: 04/27/20 10:22 AM  Result Value Ref Range   TSH 2.03 mIU/L      Assessment and Plan:  Assessment  ASSESSMENT:  1. Hypothyroidism- Clinically and biochemically euthyroid on 37.5 mcg of levothyroxine per day.  2. Short stature-  Linear growth is complete.  3. Eczema:Improved.       PLAN:  1. Diagnostic:reviewed labs. Repeat TSh, FT4 and T4  2. Therapeutic: Take 37.5 mcg of levothyroxine per day  3. Patient education: Reviewed s/s of hypothyroidism. Answered questions.  4. Follow-up: 6 months.       04/29/20,  FNP-C  Pediatric Specialist  [address]  [place], [place]  Tele: [phone]

## 2020-06-16 ENCOUNTER — Other Ambulatory Visit (INDEPENDENT_AMBULATORY_CARE_PROVIDER_SITE_OTHER): Payer: Self-pay | Admitting: Family

## 2020-06-16 DIAGNOSIS — E034 Atrophy of thyroid (acquired): Secondary | ICD-10-CM

## 2020-10-18 ENCOUNTER — Encounter (INDEPENDENT_AMBULATORY_CARE_PROVIDER_SITE_OTHER): Payer: Self-pay

## 2020-10-18 ENCOUNTER — Telehealth (INDEPENDENT_AMBULATORY_CARE_PROVIDER_SITE_OTHER): Payer: Commercial Managed Care - PPO | Admitting: Family

## 2020-10-18 NOTE — Progress Notes (Deleted)
Subjective:  Subjective  Patient Name: Sherry Erickson Date of Birth: 10/10/2000  MRN: [id number]  Sherry Erickson  presents to the office today for follow-up evaluation and management of her hypothyroidism, unknown age, delayed bone age for suspected age.    HISTORY OF PRESENT ILLNESS:   Sherry Erickson is a 20 y.o. [name]   Sherry Erickson was accompanied by her mother.   1. Dotsie was adopted from [place] in December 2012. She was believed to be about 19 years old at the time with an assigned birth date of 2001/08/18.She was reportedly found by the orphanage several days after birth.  She was adopted from an orphanage in a poor area with limited access to nutrition or medical care. On arrival in the [place] she had a battery of labs which included thyroid function studies and a bone age. Her bone age was read by radiology as consistent with 10 years. We read her film together in clinic today and felt that it was not as advanced as 10 although it is more advanced than 8 years 10 months (the prior plate). This would put her bone age somewhere around 9 years 3-6 months.   Her TSH on her initial labs was 6.32 with a free T4 of 0.87 ng/mL and a total T3 of 154 ng/dL.      2. The patient's last PSSG visit was on 10/2019. In the interim, she has been generally healthy.    She is doing well, mainly staying at home and helping with family after father recently had MI. She has started exercising daily on treadmill and is eating healthy. Taking 37.5 mcg of levothyroxine per day, rarely misses any doses. Denies fatigue, cold intolerance and constipation    3. Pertinent Review of Systems:  Review of Systems  Constitutional: Negative for malaise/fatigue and weight loss.  Eyes: Negative for blurred vision and photophobia.  Respiratory: Negative for cough and wheezing.   Cardiovascular: Negative for chest pain and palpitations.  Gastrointestinal: Negative for abdominal pain, constipation, diarrhea, nausea and vomiting.   Genitourinary: Negative for frequency and urgency.  Musculoskeletal: Negative for neck pain.  Neurological: Negative for tremors, sensory change, weakness and headaches.  Endo/Heme/Allergies: Negative for polydipsia.  Psychiatric/Behavioral: Negative for depression. The patient is not nervous/anxious.    Sherry Erickson Kitchen   PAST MEDICAL, FAMILY, AND SOCIAL HISTORY  Past Medical History:  Diagnosis Date  . Acne   . Allergy    Phreesia 04/28/2020  . Eczema   . Hypothyroidism   . Thyroid disease    Phreesia 04/28/2020    Family History  Adopted: Yes     Current Outpatient Medications:  .  diphenhydrAMINE (BENADRYL) 12.5 MG/5ML liquid, Take by mouth 4 (four) times daily as needed., Disp: , Rfl:  .  fluconazole (DIFLUCAN) 150 MG tablet, , Disp: , Rfl:  .  levothyroxine (SYNTHROID) 75 MCG tablet, TAKE 1/2 TABLET BY MOUTH  DAILY., Disp: 45 tablet, Rfl: 3 .  loratadine (CLARITIN) 10 MG tablet, Take 10 mg by mouth daily., Disp: , Rfl:  .  mometasone (ELOCON) 0.1 % ointment, Apply to eczema once daily after bathing.  Can apply a second time of day if needed., Disp: 90 g, Rfl: 3  Allergies as of 10/18/2020 - Review Complete 04/28/2020  Allergen Reaction Noted  . Amoxicillin Hives, Itching, and Rash 04/28/2020  . Other Itching and Rash 04/28/2020  . Gold-containing drug products  06/18/2018  . Coconut oil Rash 03/21/2019  . Milk-related compounds Rash 05/24/2015  reports that she has never smoked. She has never used smokeless tobacco. She reports that she does not drink alcohol and does not use drugs. Pediatric History  Patient Parents  . Baum,Robert (Father)  . Farewell,Regina (Mother)   Other Topics Concern  . 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 Lives with adopted parents and 5 siblings. Came to live with family in December 2012 from [place]. Adopted another child in March 2014 (was a friend from orphanage).    Home school- Middle school level.    Primary Care Provider: Johny Blamer, MD  ROS: There are no other significant problems involving Zora's other body systems.    Objective:  Objective  Vital Signs:  There were no vitals taken for this visit.  Growth percentile SmartLinks can only be used for patients less than 7 years old.  Ht Readings from Last 3 Encounters:  02/10/19 4\' 10"  (1.473 m) (<1 %, Z= -2.45)*  12/18/18 4' 8.75" (1.441 m) (<1 %, Z= -2.94)*  06/18/18 4' 8.81" (1.443 m) (<1 %, Z= -2.91)*   * Growth percentiles are based on CDC (Girls, 2-20 Years) data.   Wt Readings from Last 3 Encounters:  10/30/19 105 lb (47.6 kg) (8 %, Z= -1.41)*  04/07/19 104 lb 8 oz (47.4 kg) (8 %, Z= -1.41)*  03/21/19 107 lb 3.2 oz (48.6 kg) (11 %, Z= -1.20)*   * Growth percentiles are based on CDC (Girls, 2-20 Years) data.   HC Readings from Last 3 Encounters:  No data found for [facility]   There is no height or weight on file to calculate BSA. Facility age limit for growth percentiles is 20 years. Facility age limit for growth percentiles is 20 years.    PHYSICAL EXAM: telehealth visit. Unable to examine.    LAB DATA:   No results found for this or any previous visit (from the past 672 hour(s)).    Assessment and Plan:  Assessment  ASSESSMENT:  1. Hypothyroidism- CLinically euthyroid on 37.5 mcg of levothyroxine per day        PLAN:  1. Diagnostic:TSH, Ft4 ordered  2. Therapeutic: Take 37.5 mcg of levothyroxine per day  3. Patient education: answered families questions and addressed concerns.  4. Follow-up: 6 months.       [facility],  FNP-C  Pediatric Specialist  [address]  [place], [place]  Tele: [phone]

## 2020-11-02 ENCOUNTER — Telehealth (INDEPENDENT_AMBULATORY_CARE_PROVIDER_SITE_OTHER): Payer: Commercial Managed Care - PPO | Admitting: Family

## 2020-11-02 ENCOUNTER — Other Ambulatory Visit: Payer: Self-pay

## 2020-11-02 ENCOUNTER — Encounter (INDEPENDENT_AMBULATORY_CARE_PROVIDER_SITE_OTHER): Payer: Self-pay | Admitting: Family

## 2020-11-02 DIAGNOSIS — E039 Hypothyroidism, unspecified: Secondary | ICD-10-CM | POA: Diagnosis not present

## 2020-11-02 NOTE — Progress Notes (Signed)
This is a Pediatric Specialist E-Visit follow up consult provided via  MyChart video visit.  Sherry Erickson and their parent/guardian consented to an E-Visit consult today.  Location of patient: Sherry Erickson is at home (location) Location of provider:Laelle Bridgett Dalbert Garnet, NP [facility] at [facility] (location) Patient was referred by Johny Blamer, MD   The following participants were involved in this E-Visit: Sherry Erickson, Estill Bamberg, Da'Shaunia R. CMA, Gretchen Short, NP (list of participants and their roles)  Chief Complain/ Reason for E-Visit today: hypothyroidism follow up  Total time on call: >30  spent today reviewing the medical chart, counseling the patient/family, and documenting today's visit.   Follow up: 6 months.     Subjective:  Subjective  Patient Name: Sherry Erickson Date of Birth: 02-13-2001  MRN: [id number]  Sherry Erickson  presents to the office today for follow-up evaluation and management of her hypothyroidism, unknown age, delayed bone age for suspected age.    HISTORY OF PRESENT ILLNESS:   Sherry Erickson is a 20 y.o. [name]   Sherry Erickson was accompanied by her mother.   1. Sherry Erickson was adopted from [place] in December 2012. She was believed to be about 20 years old at the time with an assigned birth date of 15-Jul-2001.She was reportedly found by the orphanage several days after birth.  She was adopted from an orphanage in a poor area with limited access to nutrition or medical care. On arrival in the [place] she had a battery of labs which included thyroid function studies and a bone age. Her bone age was read by radiology as consistent with 10 years. We read her film together in clinic today and felt that it was not as advanced as 10 although it is more advanced than 8 years 10 months (the prior plate). This would put her bone age somewhere around 9 years 3-6 months.   Her TSH on her initial labs was 6.32 with a free T4 of 0.87 ng/mL and a total T3 of 154 ng/dL.      2. The patient's last [facility]  visit was on 10/2019. In the interim, she has been generally healthy.    Sherry Erickson is doing well, she has stayed busy with school and running on treadmill. She is taking 37.5 mcg of levothyroxine per day. Denies missed doses. No fatigue, constipation or cold intolerance.    3. Pertinent Review of Systems:  Review of Systems  Constitutional: Negative for malaise/fatigue and weight loss.  Eyes: Negative for blurred vision and photophobia.  Respiratory: Negative for cough and wheezing.   Cardiovascular: Negative for chest pain and palpitations.  Gastrointestinal: Negative for abdominal pain, constipation, diarrhea, nausea and vomiting.  Genitourinary: Negative for frequency and urgency.  Musculoskeletal: Negative for neck pain.  Neurological: Negative for tremors, sensory change, weakness and headaches.  Endo/Heme/Allergies: Negative for polydipsia.  Psychiatric/Behavioral: Negative for depression. The patient is not nervous/anxious.    Sherry Erickson Kitchen   PAST MEDICAL, FAMILY, AND SOCIAL HISTORY  Past Medical History:  Diagnosis Date  . Acne   . Allergy    Phreesia 04/28/2020  . Eczema   . Hypothyroidism   . Thyroid disease    Phreesia 04/28/2020    Family History  Adopted: Yes     Current Outpatient Medications:  .  diphenhydrAMINE (BENADRYL) 12.5 MG/5ML liquid, Take by mouth 4 (four) times daily as needed., Disp: , Rfl:  .  fluconazole (DIFLUCAN) 150 MG tablet, , Disp: , Rfl:  .  levothyroxine (SYNTHROID) 75 MCG tablet, TAKE 1/2 TABLET BY MOUTH  DAILY., Disp: 45 tablet, Rfl: 3 .  loratadine (CLARITIN) 10 MG tablet, Take 10 mg by mouth daily., Disp: , Rfl:  .  mometasone (ELOCON) 0.1 % ointment, Apply to eczema once daily after bathing.  Can apply a second time of day if needed. (Patient not taking: Reported on 11/02/2020), Disp: 90 g, Rfl: 3  Allergies as of 11/02/2020 - Review Complete 11/02/2020  Allergen Reaction Noted  . Amoxicillin Hives, Itching, and Rash 04/28/2020  . Other Itching  and Rash 04/28/2020  . Gold-containing drug products  06/18/2018  . Coconut oil Rash 03/21/2019  . Milk-related compounds Rash 05/24/2015     reports that she has never smoked. She has never used smokeless tobacco. She reports that she does not drink alcohol and does not use drugs. Pediatric History  Patient Parents  . Galasso,Robert (Father)  . Fenech,Regina (Mother)   Other Topics Concern  . 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 Lives with adopted parents and 5 siblings. Came to live with family in December 2012 from [place]. Adopted another child in March 2014 (was a friend from orphanage).    Home school- Middle school level.   Primary Care Provider: Johny Blamer, MD  ROS: There are no other significant problems involving Sherry Erickson's other body systems.    Objective:  Objective  Vital Signs:  There were no vitals taken for this visit.  Growth percentile SmartLinks can only be used for patients less than 80 years old.  Ht Readings from Last 3 Encounters:  02/10/19 4\' 10"  (1.473 m) (<1 %, Z= -2.45)*  12/18/18 4' 8.75" (1.441 m) (<1 %, Z= -2.94)*  06/18/18 4' 8.81" (1.443 m) (<1 %, Z= -2.91)*   * Growth percentiles are based on CDC (Girls, 2-20 Years) data.   Wt Readings from Last 3 Encounters:  10/30/19 105 lb (47.6 kg) (8 %, Z= -1.41)*  04/07/19 104 lb 8 oz (47.4 kg) (8 %, Z= -1.41)*  03/21/19 107 lb 3.2 oz (48.6 kg) (11 %, Z= -1.20)*   * Growth percentiles are based on CDC (Girls, 2-20 Years) data.   HC Readings from Last 3 Encounters:  No data found for [facility]   There is no height or weight on file to calculate BSA. Facility age limit for growth percentiles is 20 years. Facility age limit for growth percentiles is 20 years.    PHYSICAL EXAM: General: Well developed, well nourished female in no acute distress.   Head: Normocephalic, atraumatic.   Eyes:  Pupils equal and round. EOMI.   Sclera white.  No eye drainage.   Cardiovascular:  No cyanosis.  Respiratory: No increased work of breathing.  Skin: warm, dry.  No rash or lesions. Neurologic: alert and oriented, normal speech, no tremor    LAB DATA:   No results found for this or any previous visit (from the past 672 hour(s)).    Assessment and Plan:  Assessment  ASSESSMENT:  1. Hypothyroidism-She is clinically euthyroid on 37.5 mcg of levothyroxine per day        PLAN:  1. Diagnostic:TSH, FT4 and T4 ordered  2. Therapeutic: Take 37.5 mcg of levothyroxine per day  3. Patient education:Reviewed s./s of hypothyroidism. Discussed importance of compliance with medication. Answered questions.   4. Follow-up: 6 months.       [facility],  FNP-C  Pediatric Specialist  [address]  [place], [place]  Tele: [phone]

## 2021-01-15 LAB — TSH: TSH: 1.49 mIU/L

## 2021-01-15 LAB — T4: T4, Total: 8.9 ug/dL (ref 5.1–11.9)

## 2021-01-15 LAB — T4, FREE: Free T4: 1.3 ng/dL (ref 0.8–1.8)

## 2021-05-16 ENCOUNTER — Other Ambulatory Visit: Payer: Self-pay

## 2021-05-16 ENCOUNTER — Telehealth (INDEPENDENT_AMBULATORY_CARE_PROVIDER_SITE_OTHER): Payer: Commercial Managed Care - PPO | Admitting: Family

## 2021-05-16 ENCOUNTER — Encounter (INDEPENDENT_AMBULATORY_CARE_PROVIDER_SITE_OTHER): Payer: Self-pay | Admitting: Family

## 2021-05-16 DIAGNOSIS — E039 Hypothyroidism, unspecified: Secondary | ICD-10-CM | POA: Diagnosis not present

## 2021-05-16 DIAGNOSIS — R6250 Unspecified lack of expected normal physiological development in childhood: Secondary | ICD-10-CM | POA: Insufficient documentation

## 2021-05-16 DIAGNOSIS — R6252 Short stature (child): Secondary | ICD-10-CM | POA: Insufficient documentation

## 2021-05-16 DIAGNOSIS — D649 Anemia, unspecified: Secondary | ICD-10-CM | POA: Insufficient documentation

## 2021-05-16 NOTE — Progress Notes (Signed)
error 

## 2021-05-16 NOTE — Progress Notes (Signed)
This is a Pediatric Specialist E-Visit follow up consult provided via WebEx Sherry Erickson consented to an E-Visit consult today.  Location of patient: Sherry Erickson is at Home  Location of provider: Crist Infante is at [facility] office.  Patient was referred by Sherry Blamer, MD   The following participants were involved in this E-Visit: Sherry Erickson, Sherry Erickson  (list of participants and their roles)  This visit was done via VIDEO   Chief Complain/ Reason for E-Visit today: hypothyroidism FU  Total time on call: >20  spent today reviewing the medical chart, counseling the patient/family, and documenting today's visit.   Follow up: 6 months.    Subjective:  Subjective  Patient Name: Sherry Erickson Date of Birth: Apr 27, 2001  MRN: [id number]  Sherry Erickson  presents to the office today for follow-up evaluation and management of her hypothyroidism, unknown age, delayed bone age for suspected age.    HISTORY OF PRESENT ILLNESS:   Sherry Erickson is a 20 y.o. [name]   Sherry Erickson was accompanied by her mother.   1. Sherry Erickson was adopted from [place] in December 2012. She was believed to be about 20 years old at the time with an assigned birth date of Sep 25, 2000.She was reportedly found by the orphanage several days after birth.  She was adopted from an orphanage in a poor area with limited access to nutrition or medical care. On arrival in the [place] she had a battery of labs which included thyroid function studies and a bone age. Her bone age was read by radiology as consistent with 10 years. We read her film together in clinic today and felt that it was not as advanced as 10 although it is more advanced than 8 years 10 months (the prior plate). This would put her bone age somewhere around 9 years 3-6 months.   Her TSH on her initial labs was 6.32 with a free T4 of 0.87 ng/mL and a total T3 of 154 ng/dL.      2. The patient's last [facility] visit was on 11/2020. In the interim, she has been generally healthy.    She had  COVID recently, reports being sick for about 1 week. She has been exercising daily using treadmill but would like to get more time out of the house. She is taking 37.5 mcg of levothyroxine per day, occasionally misses a dose. Takes in the afternoon. Denies fatigue, constipation and cold intolerance.    3. Pertinent Review of Systems:  Review of Systems  Constitutional:  Negative for malaise/fatigue and weight loss.  Eyes:  Negative for blurred vision and photophobia.  Respiratory:  Negative for cough and wheezing.   Cardiovascular:  Negative for chest pain and palpitations.  Gastrointestinal:  Negative for abdominal pain, constipation, diarrhea, nausea and vomiting.  Genitourinary:  Negative for frequency and urgency.  Musculoskeletal:  Negative for neck pain.  Neurological:  Negative for tremors, sensory change, weakness and headaches.  Endo/Heme/Allergies:  Negative for polydipsia.  Psychiatric/Behavioral:  Negative for depression. The patient is not nervous/anxious.   Marland Kitchen   PAST MEDICAL, FAMILY, AND SOCIAL HISTORY  Past Medical History:  Diagnosis Date   Acne    Allergy    Phreesia 04/28/2020   Eczema    Hypothyroidism    Thyroid disease    Phreesia 04/28/2020    Family History  Adopted: Yes     Current Outpatient Medications:    diphenhydrAMINE (BENADRYL) 12.5 MG/5ML liquid, Take by mouth 4 (four) times daily as needed., Disp: , Rfl:  fluconazole (DIFLUCAN) 150 MG tablet, , Disp: , Rfl:    levothyroxine (SYNTHROID) 75 MCG tablet, TAKE 1/2 TABLET BY MOUTH  DAILY., Disp: 45 tablet, Rfl: 3   loratadine (CLARITIN) 10 MG tablet, Take 10 mg by mouth daily., Disp: , Rfl:    mometasone (ELOCON) 0.1 % ointment, Apply to eczema once daily after bathing.  Can apply a second time of day if needed. (Patient not taking: Reported on 11/02/2020), Disp: 90 g, Rfl: 3  Allergies as of 05/16/2021 - Review Complete 11/02/2020  Allergen Reaction Noted   Amoxicillin Hives, Itching, and Rash  04/28/2020   Other Itching and Rash 04/28/2020   Gold-containing drug products  06/18/2018   Coconut oil Rash 03/21/2019   Milk-related compounds Rash 05/24/2015     reports that she has never smoked. She has never used smokeless tobacco. She reports that she does not drink alcohol and does not use drugs. Pediatric History  Patient Parents   Erickson,Sherry Erickson (Father)   Erickson,Sherry (Mother)   Other Topics Concern   Not on file  Social History Narrative   High School grade home school. Lives with adopted parents and 5 siblings. Came to live with family in December 2012 from [place]. Adopted another child in March 2014 (was a friend from orphanage).    Home school- Middle school level.   Primary Care Provider: Johny Blamer, MD  ROS: There are no other significant problems involving Sherry Erickson's other body systems.    Objective:  Objective  Vital Signs:  There were no vitals taken for this visit.  Growth percentile SmartLinks can only be used for patients less than 4 years old.  Ht Readings from Last 3 Encounters:  02/10/19 4\' 10"  (1.473 m) (<1 %, Z= -2.45)*  12/18/18 4' 8.75" (1.441 m) (<1 %, Z= -2.94)*  06/18/18 4' 8.81" (1.443 m) (<1 %, Z= -2.91)*   * Growth percentiles are based on CDC (Girls, 2-20 Years) data.   Wt Readings from Last 3 Encounters:  10/30/19 105 lb (47.6 kg) (8 %, Z= -1.41)*  04/07/19 104 lb 8 oz (47.4 kg) (8 %, Z= -1.41)*  03/21/19 107 lb 3.2 oz (48.6 kg) (11 %, Z= -1.20)*   * Growth percentiles are based on CDC (Girls, 2-20 Years) data.   HC Readings from Last 3 Encounters:  No data found for [facility]   There is no height or weight on file to calculate BSA. Facility age limit for growth percentiles is 20 years. Facility age limit for growth percentiles is 20 years.    PHYSICAL EXAM: General: Well developed, well nourished female in no acute distress.   Head: Normocephalic, atraumatic.   Eyes:  Pupils equal and round. EOMI.   Sclera white.  No eye  drainage.   Cardiovascular: No cyanosis.  Respiratory: No increased work of breathing.   Skin: warm, dry.  No apparent rash or lesions. Neurologic: alert and oriented, normal speech, no tremor    LAB DATA:   No results found for this or any previous visit (from the past 672 hour(s)).    Assessment and Plan:  Assessment  ASSESSMENT:  1. Hypothyroidism-Appears clinically euthyroid on 37.5 mcg of levothyroxine per day.        PLAN:  1. Diagnostic:Ordered TFT's including TSh, FT4 and T4  2. Therapeutic: Take 37.5 mcg of levothyroxine per day  3. Patient education: Discussed s/s of hypothyroidism. Take levothyroxine every morning on empty stomach. If you forget one dose, can double the next day.  4. Follow-up: 6  months.       Gretchen Short,  FNP-C  Pediatric Specialist  [address]  [place], [address]  Tele: [phone]

## 2021-05-16 NOTE — Patient Instructions (Signed)
-   Continue 37.5 mcg of levothyroxine per day  - Labs ordered  - Follow up in 6 months.

## 2021-05-18 LAB — T4, FREE: Free T4: 1.2 ng/dL (ref 0.8–1.8)

## 2021-05-18 LAB — T4: T4, Total: 8.7 ug/dL (ref 5.1–11.9)

## 2021-05-18 LAB — TSH: TSH: 1.3 mIU/L

## 2021-05-22 ENCOUNTER — Other Ambulatory Visit (INDEPENDENT_AMBULATORY_CARE_PROVIDER_SITE_OTHER): Payer: Self-pay | Admitting: Family

## 2021-05-22 DIAGNOSIS — E034 Atrophy of thyroid (acquired): Secondary | ICD-10-CM

## 2021-11-08 ENCOUNTER — Other Ambulatory Visit (INDEPENDENT_AMBULATORY_CARE_PROVIDER_SITE_OTHER): Payer: Self-pay | Admitting: Family

## 2021-11-08 DIAGNOSIS — E034 Atrophy of thyroid (acquired): Secondary | ICD-10-CM

## 2022-02-06 ENCOUNTER — Other Ambulatory Visit (INDEPENDENT_AMBULATORY_CARE_PROVIDER_SITE_OTHER): Payer: Self-pay

## 2022-02-06 DIAGNOSIS — E039 Hypothyroidism, unspecified: Secondary | ICD-10-CM

## 2022-02-07 LAB — T4, FREE: Free T4: 1.2 ng/dL (ref 0.8–1.8)

## 2022-02-07 LAB — TSH: TSH: 2.22 mIU/L

## 2022-02-07 LAB — T4: T4, Total: 9 ug/dL (ref 5.1–11.9)

## 2022-02-10 ENCOUNTER — Encounter (INDEPENDENT_AMBULATORY_CARE_PROVIDER_SITE_OTHER): Payer: Self-pay | Admitting: Family

## 2022-02-10 ENCOUNTER — Telehealth (INDEPENDENT_AMBULATORY_CARE_PROVIDER_SITE_OTHER): Payer: Commercial Managed Care - PPO | Admitting: Family

## 2022-02-10 DIAGNOSIS — E039 Hypothyroidism, unspecified: Secondary | ICD-10-CM

## 2022-02-10 NOTE — Progress Notes (Signed)
as This is a Pediatric Specialist E-Visit consult/follow up provided via My Chart Sherry Erickson consented to an E-Visit consult today.  Location of patient: Sherry Erickson is at home Location of provider: Gretchen Short, NP is at [facility] Patient was referred by Johny Blamer, MD   The following participants were involved in this E-Visit: Cammie Mcgee, Da'Shaunia Ridenhour, CMA, and Gretchen Short, NP  This visit was done via VIDEO   Chief Complain/ Reason for E-Visit today: Hypothyroidism  Total time on call: >15 spent today reviewing the medical chart, counseling the patient/family, and documenting today's visit.   Follow up: none. Establish with Adult Endocrine.     Subjective:  Subjective  Patient Name: Sherry Erickson Date of Birth: [birth year]  MRN: [id number]  Sherry Erickson  presents to the office today for follow-up evaluation and management of her hypothyroidism, unknown age, delayed bone age for suspected age.    HISTORY OF PRESENT ILLNESS:   Sherry Erickson is a 21 y.o. [name]   Sherry Erickson was accompanied by her mother.   1. Sherry Erickson was adopted from [place] in December 2012. She was believed to be about 21 years old at the time with an assigned birth date of November 26, 2000.She was reportedly found by the orphanage several days after birth.  She was adopted from an orphanage in a poor area with limited access to nutrition or medical care. On arrival in the [place] she had a battery of labs which included thyroid function studies and a bone age. Her bone age was read by radiology as consistent with 10 years. We read her film together in clinic today and felt that it was not as advanced as 10 although it is more advanced than 8 years 10 months (the prior plate). This would put her bone age somewhere around 9 years 3-6 months.   Her TSH on her initial labs was 6.32 with a free T4 of 0.87 ng/mL and a total T3 of 154 ng/dL.      2. The patient's last [facility] visit was on 05/2021. In the interim, she has been  generally healthy.    Sherry Erickson is finishing high school and will be starting college in the fall. Her mother recently had a broken foot so she has been helping with activities around the house. She is taking 37.5 mcg of levothyroxine per day and reports feeling great. She denies fatigue, cold intolerance and constipation.    3. Pertinent Review of Systems:  Review of Systems  Constitutional:  Negative for malaise/fatigue and weight loss.  Eyes:  Negative for blurred vision and photophobia.  Respiratory:  Negative for cough and wheezing.   Cardiovascular:  Negative for chest pain and palpitations.  Gastrointestinal:  Negative for abdominal pain, constipation, diarrhea, nausea and vomiting.  Genitourinary:  Negative for frequency and urgency.  Musculoskeletal:  Negative for neck pain.  Neurological:  Negative for tremors, sensory change, weakness and headaches.  Endo/Heme/Allergies:  Negative for polydipsia.  Psychiatric/Behavioral:  Negative for depression. The patient is not nervous/anxious.    Marland Kitchen   PAST MEDICAL, FAMILY, AND SOCIAL HISTORY  Past Medical History:  Diagnosis Date   Acne    Allergy    Phreesia 04/28/2020   Eczema    Hypothyroidism    Thyroid disease    Phreesia 04/28/2020    Family History  Adopted: Yes     Current Outpatient Medications:    diphenhydrAMINE (BENADRYL) 12.5 MG/5ML liquid, Take by mouth 4 (four) times daily as needed. (Patient not taking: Reported on 05/16/2021),  Disp: , Rfl:    fluconazole (DIFLUCAN) 150 MG tablet, , Disp: , Rfl:    levothyroxine (SYNTHROID) 75 MCG tablet, TAKE ONE-HALF TABLET BY  MOUTH DAILY, Disp: 45 tablet, Rfl: 3   loratadine (CLARITIN) 10 MG tablet, Take 10 mg by mouth daily. (Patient not taking: Reported on 05/16/2021), Disp: , Rfl:    mometasone (ELOCON) 0.1 % ointment, Apply to eczema once daily after bathing.  Can apply a second time of day if needed. (Patient taking differently: Apply to eczema once daily after bathing.   Can apply a second time of day if needed.), Disp: 90 g, Rfl: 3  Allergies as of 02/10/2022 - Review Complete 05/16/2021  Allergen Reaction Noted   Amoxicillin Hives, Itching, and Rash 04/28/2020   Other Itching and Rash 04/28/2020   Gold-containing drug products  06/18/2018   Coconut (cocos nucifera) Rash 03/21/2019   Milk-related compounds Rash 05/24/2015     reports that she has never smoked. She has never used smokeless tobacco. She reports that she does not drink alcohol and does not use drugs. Pediatric History  Patient Parents   Kauffman,Robert (Father)   Tisdell,Regina (Mother)   Other Topics Concern   Not on file  Social History Narrative   High School grade home school. Lives with adopted parents and 5 siblings. Came to live with family in December 2012 from [place]. Adopted another child in March 2014 (was a friend from orphanage).    Home school- Middle school level.   Primary Care Provider: Johny BlamerHarris, William, MD  ROS: There are no other significant problems involving Sherry Erickson's other body systems.    Objective:  Objective  Vital Signs:  There were no vitals taken for this visit.  Growth %ile SmartLinks can only be used for patients less than 22 years old.  Ht Readings from Last 3 Encounters:  02/10/19 4\' 10"  (1.473 m) (<1 %, Z= -2.45)*  12/18/18 4' 8.75" (1.441 m) (<1 %, Z= -2.94)*  06/18/18 4' 8.81" (1.443 m) (<1 %, Z= -2.91)*   * Growth percentiles are based on CDC (Girls, 2-20 Years) data.   Wt Readings from Last 3 Encounters:  10/30/19 105 lb (47.6 kg) (8 %, Z= -1.41)*  04/07/19 104 lb 8 oz (47.4 kg) (8 %, Z= -1.41)*  03/21/19 107 lb 3.2 oz (48.6 kg) (11 %, Z= -1.20)*   * Growth percentiles are based on CDC (Girls, 2-20 Years) data.   HC Readings from Last 3 Encounters:  [facility]   There is no height or weight on file to calculate BSA. Facility age limit for growth %iles is 20 years. Facility age limit for growth %iles is 20 years.    PHYSICAL  EXAM: General: Well developed, well nourished female in no acute distress. Head: Normocephalic, atraumatic.   Eyes:  Pupils equal and round. EOMI.   Sclera white.  No eye drainage.   Cardiovascular: No cyanosis.  Respiratory: No increased work of breathing.   Skin:   No rash or lesions. Neurologic: alert and oriented, normal speech, no tremor     LAB DATA:   Results for orders placed or performed in visit on 02/06/22 (from the past 672 hour(s))  T4   Collection Time: 02/06/22  2:24 PM  Result Value Ref Range   T4, Total 9.0 5.1 - 11.9 mcg/dL  T4, free   Collection Time: 02/06/22  2:24 PM  Result Value Ref Range   Free T4 1.2 0.8 - 1.8 ng/dL  TSH  Collection Time: 02/06/22  2:24 PM  Result Value Ref Range   TSH 2.22 mIU/L      Assessment and Plan:  Assessment  ASSESSMENT:  1. Hypothyroidism- She is clinically and biochemically euthyroid on 37.5 mcg of levothyroxine per day.        PLAN:  1. Diagnostic:Reviewed labs with patient.  2. Therapeutic: Take 37.5 mcg of levothyroxine per day  3. Patient education: Discussed s/s of hypothyroidism. Take levothyroxine every morning on empty stomach. Refer to adult endocrinology due to age. Will refer to [facility] endocrine per family request.  4. Follow-up: none.       Gretchen Short,  FNP-C  Pediatric Specialist  [address]  [place], [address]  Tele: [phone]

## 2022-02-10 NOTE — Patient Instructions (Signed)
37.5 mcg of levothyroxine per day   -Take your medication at the same time every day -Try to take it on an empty stomach -If you forget to take a dose, take it as soon as you remember.  If you don't remember until the next day, take 2 doses then.  NEVER take more than 2 doses at a time. -Use a pill box to help make it easier to keep track of doses

## 2022-10-11 ENCOUNTER — Other Ambulatory Visit (INDEPENDENT_AMBULATORY_CARE_PROVIDER_SITE_OTHER): Payer: Self-pay | Admitting: Family

## 2022-10-11 DIAGNOSIS — E034 Atrophy of thyroid (acquired): Secondary | ICD-10-CM

## 2023-04-14 ENCOUNTER — Emergency Department (HOSPITAL_BASED_OUTPATIENT_CLINIC_OR_DEPARTMENT_OTHER)
Admission: EM | Admit: 2023-04-14 | Discharge: 2023-04-14 | Disposition: A | Discharge status: Home / Self Care | Payer: Commercial Managed Care - PPO | Attending: Emergency Medicine | Admitting: Emergency Medicine

## 2023-04-14 ENCOUNTER — Encounter (HOSPITAL_BASED_OUTPATIENT_CLINIC_OR_DEPARTMENT_OTHER): Payer: Self-pay | Admitting: Emergency Medicine

## 2023-04-14 ENCOUNTER — Other Ambulatory Visit: Payer: Self-pay

## 2023-04-14 DIAGNOSIS — Z1152 Encounter for screening for COVID-19: Secondary | ICD-10-CM | POA: Insufficient documentation

## 2023-04-14 DIAGNOSIS — J101 Influenza due to other identified influenza virus with other respiratory manifestations: Secondary | ICD-10-CM | POA: Diagnosis not present

## 2023-04-14 DIAGNOSIS — R000 Tachycardia, unspecified: Secondary | ICD-10-CM | POA: Insufficient documentation

## 2023-04-14 DIAGNOSIS — R059 Cough, unspecified: Secondary | ICD-10-CM | POA: Diagnosis present

## 2023-04-14 LAB — RESP PANEL BY RT-PCR (RSV, FLU A&B, COVID)  RVPGX2
Influenza A by PCR: POSITIVE — AB
Influenza B by PCR: NEGATIVE
Resp Syncytial Virus by PCR: NEGATIVE
SARS Coronavirus 2 by RT PCR: NEGATIVE

## 2023-04-14 MED ORDER — SODIUM CHLORIDE 0.9 % IV BOLUS
1000.0000 mL | Freq: Once | INTRAVENOUS | Status: AC
  Administered 2023-04-14: 1000 mL via INTRAVENOUS

## 2023-04-14 MED ORDER — OSELTAMIVIR PHOSPHATE 75 MG PO CAPS: 75.0000 mg | ORAL_CAPSULE | Freq: Two times a day (BID) | ORAL | 0 refills | Status: AC

## 2023-04-14 MED ORDER — ACETAMINOPHEN 325 MG PO TABS
650.0000 mg | ORAL_TABLET | Freq: Once | ORAL | Status: AC
  Administered 2023-04-14: 650 mg via ORAL
  Filled 2023-04-14: qty 2

## 2023-04-14 MED ORDER — IBUPROFEN 400 MG PO TABS
600.0000 mg | ORAL_TABLET | Freq: Once | ORAL | Status: AC
  Administered 2023-04-14: 600 mg via ORAL
  Filled 2023-04-14: qty 1

## 2023-04-14 NOTE — Discharge Instructions (Addendum)
It was a pleasure taking care of you today!   Your COVID and RSV swabs were negative today. Your flu swab was positive for Flu A.  You will be sent a prescription for Tamiflu, take as directed.  You may continue with over the counter cough and cold medications. Ensure to maintain fluid intake. Follow up with your primary care provider regarding todays ED visit. Ensure that you are wearing your mask and practicing good hand hygiene. Return to the ED if you are experiencing increasing/worsening symptoms.

## 2023-04-14 NOTE — ED Triage Notes (Signed)
Cough , fever , body aches , exposed to her brother with Flu A .

## 2023-04-14 NOTE — ED Provider Notes (Signed)
[place] [facility] Provider Note   CSN: [id number] Arrival date & time: 04/14/23  1337     History  Chief Complaint  Patient presents with   URI    Sherry Erickson is a 22 y.o. female who presents to the ED with concerns for URI like symptoms. Notes sick contacts at home of her brother who has Flu A. Has cough, fever, generalized body aches.  No meds tried at home. Denies chest pain, shortness of breath, sore throat.  The history is provided by the patient. No language interpreter was used.       Home Medications Prior to Admission medications   Medication Sig Start Date End Date Taking? Authorizing Provider  oseltamivir (TAMIFLU) 75 MG capsule Take 1 capsule (75 mg total) by mouth every 12 (twelve) hours. 04/14/23  Yes Verdean Murin A, PA-C  diphenhydrAMINE (BENADRYL) 12.5 MG/5ML liquid Take by mouth 4 (four) times daily as needed. Patient not taking: Reported on 05/16/2021    [provider]  fluconazole (DIFLUCAN) 150 MG tablet  04/04/19   [provider]  levothyroxine (SYNTHROID) 75 MCG tablet TAKE ONE-HALF TABLET BY  MOUTH DAILY 11/09/21   Gretchen Short, NP  loratadine (CLARITIN) 10 MG tablet Take 10 mg by mouth daily. Patient not taking: Reported on 05/16/2021    [provider]  mometasone (ELOCON) 0.1 % ointment Apply to eczema once daily after bathing.  Can apply a second time of day if needed. Patient not taking: Reported on 02/10/2022 01/09/19   Jessica Priest, MD      Allergies    Amoxicillin, Other, Gold-containing drug products, Coconut (cocos nucifera), and Milk-related compounds    Review of Systems   Review of Systems  All other systems reviewed and are negative.   Physical Exam Updated Vital Signs BP (!) 94/58 (BP Location: Left Arm)   Pulse (!) 106   Temp 98.6 F (37 C) (Oral)   Resp 16   Wt 47.6 kg   LMP 03/22/2023 (Exact Date)   SpO2 100%   BMI 21.95 kg/m  Physical Exam Vitals and  nursing note reviewed.  Constitutional:      General: She is not in acute distress.    Appearance: She is not diaphoretic.  HENT:     Head: Normocephalic and atraumatic.     Mouth/Throat:     Mouth: Mucous membranes are moist.     Pharynx: Oropharynx is clear. Uvula midline. No oropharyngeal exudate or posterior oropharyngeal erythema.     Comments: Uvula midline without swelling. No posterior pharyngeal erythema or tonsillar exudate noted. Patent airway. Pt able to speak in clear complete sentences. Tolerating oral secretions. Eyes:     General: No scleral icterus.    Conjunctiva/sclera: Conjunctivae normal.  Cardiovascular:     Rate and Rhythm: Normal rate and regular rhythm.     Pulses: Normal pulses.     Heart sounds: Normal heart sounds.  Pulmonary:     Effort: Pulmonary effort is normal. No respiratory distress.     Breath sounds: Normal breath sounds. No wheezing.  Abdominal:     General: Bowel sounds are normal.     Palpations: Abdomen is soft. There is no mass.     Tenderness: There is no abdominal tenderness. There is no guarding or rebound.  Musculoskeletal:        General: Normal range of motion.     Cervical back: Normal range of motion and neck supple.  Skin:  General: Skin is warm and dry.  Neurological:     Mental Status: She is alert.  Psychiatric:        Behavior: Behavior normal.     ED Results / Procedures / Treatments   Labs (all labs ordered are listed, but only abnormal results are displayed) Labs Reviewed  RESP PANEL BY RT-PCR (RSV, FLU A&B, COVID)  RVPGX2 - Abnormal; Notable for the following components:      Result Value   Influenza A by PCR POSITIVE (*)    All other components within normal limits    EKG None  Radiology No results found.  Procedures Procedures    Medications Ordered in ED Medications  acetaminophen (TYLENOL) tablet 650 mg (650 mg Oral Given 04/14/23 1420)  sodium chloride 0.9 % bolus 1,000 mL (0 mLs Intravenous  Stopped 04/14/23 1726)  ibuprofen (ADVIL) tablet 600 mg (600 mg Oral Given 04/14/23 1623)    ED Course/ Medical Decision Making/ A&P Clinical Course as of 04/14/23 1807  Sat Apr 14, 2023  1559 Influenza A By PCR(!): POSITIVE [SB]    Clinical Course User Index [SB] Shadee Rathod A, PA-C                                 Medical Decision Making Amount and/or Complexity of Data Reviewed Labs:  Decision-making details documented in ED Course.  Risk OTC drugs. Prescription drug management.   Pt presents with flulike symptoms onset last night.  Sick contacts at home with her sibling who has flu A.  Patient initially febrile at 101.9, tachycardic initially at 124.  Tachycardia improved to 100 at time of discharge.  On exam patient with Uvula midline without swelling. No posterior pharyngeal erythema or tonsillar exudate noted. Patent airway. Pt able to speak in clear complete sentences. Tolerating oral secretions. No acute cardiovascular, respiratory, abdominal exam findings. Differential diagnosis includes COVID, flu, RSV, strep pharyngitis, viral pharyngitis, viral URI with cough, PNA.    Labs:  I ordered, and personally interpreted labs.  The pertinent results include:   Positive influenza A Negative COVID, flu B, RSV    Medications:  I ordered medication including IV fluids, Tylenol, ibuprofen for symptom management Reevaluation of the patient after these medicines and interventions, I reevaluated the patient and found that they have improved I have reviewed the patients home medicines and have made adjustments as needed   Disposition: Presentation suspicious for Flu A. Doubt COVID, RSV, or PNA at this time. Discussed with patient regarding use of Tamiflu and discussed thoroughly on its risks and benefits. Follow discussion, patient opted for supportive care at this time. Work/school note provided. Discussed with patient that they should not attend work until they are fever free for  24 hours. After consideration of the diagnostic results and the patients response to treatment, I feel that the patient would benefit from Discharge home.  Patient discharged home with heart rate at 100.  Patient overall well-appearing.  Rx for Tamiflu sent. Supportive care measures and strict return precautions discussed with patient at bedside. Pt acknowledges and verbalizes understanding. Pt appears safe for discharge. Follow up as indicated in discharge paperwork.    This chart was dictated using voice recognition software, Dragon. Despite the best efforts of this provider to proofread and correct errors, errors may still occur which can change documentation meaning.  Final Clinical Impression(s) / ED Diagnoses Final diagnoses:  Influenza A    Rx /  DC Orders ED Discharge Orders          Ordered    oseltamivir (TAMIFLU) 75 MG capsule  Every 12 hours        04/14/23 1738              Eilene Voigt A, PA-C 04/14/23 1807    Charlynne Pander, MD 04/14/23 [phone]

## 2023-12-11 ENCOUNTER — Encounter (INDEPENDENT_AMBULATORY_CARE_PROVIDER_SITE_OTHER): Payer: Self-pay

## 2023-12-24 ENCOUNTER — Encounter (INDEPENDENT_AMBULATORY_CARE_PROVIDER_SITE_OTHER): Payer: Self-pay
